# Patient Record
Sex: Male | Born: 1950 | Race: Black or African American | Hispanic: No | Marital: Married | Smoking: Never smoker
Health system: Southern US, Community
[De-identification: ages and names within clinical notes are randomized; demographics above are authoritative.]

---

## 1999-10-30 ENCOUNTER — Encounter: Payer: Self-pay | Admitting: Otolaryngology

## 1999-10-30 ENCOUNTER — Encounter: Admission: RE | Admit: 1999-10-30 | Discharge: 1999-10-30 | Payer: Self-pay | Admitting: Otolaryngology

## 2009-04-03 ENCOUNTER — Ambulatory Visit: Payer: Self-pay | Admitting: Internal Medicine

## 2009-04-03 ENCOUNTER — Ambulatory Visit (HOSPITAL_BASED_OUTPATIENT_CLINIC_OR_DEPARTMENT_OTHER): Admission: RE | Admit: 2009-04-03 | Discharge: 2009-04-03 | Payer: Self-pay | Admitting: Otolaryngology

## 2009-12-13 ENCOUNTER — Encounter: Admission: RE | Admit: 2009-12-13 | Discharge: 2009-12-13 | Payer: Self-pay | Admitting: Emergency Medicine

## 2011-04-05 NOTE — Procedures (Signed)
NAME:  Brandon Nixon, Brandon Nixon                  ACCOUNT NO.:  [id number]   MEDICAL RECORD NO.:  [id number]          PATIENT TYPE:  OUT   LOCATION:  SLEEP CENTER                 FACILITY:  [facility]   PHYSICIAN:  Clinton D. Maple Hudson, MD, FCCP, FACPDATE OF BIRTH:  1949/01/14   DATE OF STUDY:                            NOCTURNAL POLYSOMNOGRAM   REFERRING PHYSICIAN:  Jefry H. Pollyann Kennedy, MD   INDICATION FOR STUDY:  Hypersomnia with sleep apnea.   EPWORTH SLEEPINESS SCORE:  Epworth sleepiness score 8/24.  BMI 28.  Weight 190 pounds, height 69 inches.  Neck 14 inches.   MEDICATIONS:  Home medication charted and reviewed.   SLEEP ARCHITECTURE:  Split study protocol.  During the diagnostic phase,  total sleep time 143 minutes with sleep efficiency 77.4%.  Stage I was  13.2%, stage II 62%, stage III absent, REM 24.7% of total sleep time.  Sleep latency 27 minutes, REM latency 40 minutes, awake after sleep  onset 15 minutes, arousal index 46 indicating increased EEG arousal.  No  bedtime medication was taken.   RESPIRATORY DATA:  Split study protocol.  Apnea/hypopnea index (AHI)  38.9 per hour.  A total of 93 events was scored including 79 obstructive  apneas, one mixed apnea, and 13 hypopneas.  Events were more common  while supine, but seen in all sleep positions.  REM AHI 49.  CPAP was  then titrated to 12 CWP, AHI 0 per hour.  He wore a medium ResMed  Quattro full-face mask with heated humidifier.   OXYGEN DATA:  Moderately loud snoring before CPAP with oxygen  desaturation to a nadir of 68%.  After CPAP control, mean oxygen  saturation held 96.7% on room air.   CARDIAC DATA:  Sinus rhythm with frequent PVCs including intervals of  ventricular bigeminy.   MOVEMENT-PARASOMNIA:  No significant movement disturbance.  Bathroom x1.   IMPRESSIONS-RECOMMENDATIONS:  1. Moderate obstructive sleep apnea/hypopnea syndrome, AHI 38.9 per      hour.  Events in all positions, but more common while supine.  Moderately loud snoring with oxygen desaturation to a nadir of 68%.  2. Successful CPAP titration to 12 CWP, AHI 0 per hour.  He wore a      medium ResMed Quattro full-face mask      with heated humidifier.  3. Frequent PVCs including ventricular bigeminy.      Clinton D. Maple Hudson, MD, [facility], FACP  Diplomate, Biomedical engineer of Sleep Medicine  Electronically Signed     CDY/MEDQ  D:  04/08/2009 09:49:53  T:  04/09/2009 03:52:06  Job:  [id number]

## 2012-08-28 ENCOUNTER — Ambulatory Visit
Admission: RE | Admit: 2012-08-28 | Discharge: 2012-08-28 | Disposition: A | Discharge status: Home / Self Care | Payer: 59 | Source: Ambulatory Visit | Attending: Family Medicine | Admitting: Family Medicine

## 2012-08-28 ENCOUNTER — Other Ambulatory Visit: Payer: Self-pay | Admitting: Family Medicine

## 2012-08-28 DIAGNOSIS — M25532 Pain in left wrist: Secondary | ICD-10-CM

## 2012-08-28 DIAGNOSIS — M25531 Pain in right wrist: Secondary | ICD-10-CM

## 2015-05-30 ENCOUNTER — Other Ambulatory Visit: Payer: Self-pay | Admitting: Family Medicine

## 2015-05-30 DIAGNOSIS — Z0000 Encounter for general adult medical examination without abnormal findings: Secondary | ICD-10-CM

## 2015-06-16 ENCOUNTER — Ambulatory Visit
Admission: RE | Admit: 2015-06-16 | Discharge: 2015-06-16 | Disposition: A | Discharge status: Home / Self Care | Payer: Medicare Other | Source: Ambulatory Visit | Attending: Family Medicine | Admitting: Family Medicine

## 2015-06-16 DIAGNOSIS — Z0000 Encounter for general adult medical examination without abnormal findings: Secondary | ICD-10-CM

## 2015-12-08 DIAGNOSIS — E785 Hyperlipidemia, unspecified: Secondary | ICD-10-CM | POA: Diagnosis not present

## 2016-02-23 DIAGNOSIS — M4806 Spinal stenosis, lumbar region: Secondary | ICD-10-CM | POA: Diagnosis not present

## 2016-02-23 DIAGNOSIS — E785 Hyperlipidemia, unspecified: Secondary | ICD-10-CM | POA: Diagnosis not present

## 2016-02-23 DIAGNOSIS — M5126 Other intervertebral disc displacement, lumbar region: Secondary | ICD-10-CM | POA: Diagnosis not present

## 2016-02-23 DIAGNOSIS — M5410 Radiculopathy, site unspecified: Secondary | ICD-10-CM | POA: Diagnosis not present

## 2016-02-27 DIAGNOSIS — H1045 Other chronic allergic conjunctivitis: Secondary | ICD-10-CM | POA: Diagnosis not present

## 2016-02-27 DIAGNOSIS — H43811 Vitreous degeneration, right eye: Secondary | ICD-10-CM | POA: Diagnosis not present

## 2016-02-27 DIAGNOSIS — H2513 Age-related nuclear cataract, bilateral: Secondary | ICD-10-CM | POA: Diagnosis not present

## 2016-11-05 DIAGNOSIS — I10 Essential (primary) hypertension: Secondary | ICD-10-CM | POA: Diagnosis not present

## 2016-11-05 DIAGNOSIS — E1122 Type 2 diabetes mellitus with diabetic chronic kidney disease: Secondary | ICD-10-CM | POA: Diagnosis not present

## 2016-11-05 DIAGNOSIS — N182 Chronic kidney disease, stage 2 (mild): Secondary | ICD-10-CM | POA: Diagnosis not present

## 2016-11-05 DIAGNOSIS — Z79899 Other long term (current) drug therapy: Secondary | ICD-10-CM | POA: Diagnosis not present

## 2016-11-05 DIAGNOSIS — E785 Hyperlipidemia, unspecified: Secondary | ICD-10-CM | POA: Diagnosis not present

## 2016-11-05 DIAGNOSIS — Z23 Encounter for immunization: Secondary | ICD-10-CM | POA: Diagnosis not present

## 2016-11-05 DIAGNOSIS — E1165 Type 2 diabetes mellitus with hyperglycemia: Secondary | ICD-10-CM | POA: Diagnosis not present

## 2017-05-02 ENCOUNTER — Other Ambulatory Visit: Payer: Self-pay | Admitting: Family Medicine

## 2017-05-02 ENCOUNTER — Ambulatory Visit
Admission: RE | Admit: 2017-05-02 | Discharge: 2017-05-02 | Disposition: A | Discharge status: Home / Self Care | Payer: 59 | Source: Ambulatory Visit | Attending: Family Medicine | Admitting: Family Medicine

## 2017-05-02 DIAGNOSIS — M1990 Unspecified osteoarthritis, unspecified site: Secondary | ICD-10-CM | POA: Diagnosis not present

## 2017-05-23 DIAGNOSIS — I10 Essential (primary) hypertension: Secondary | ICD-10-CM | POA: Diagnosis not present

## 2017-05-23 DIAGNOSIS — Z79899 Other long term (current) drug therapy: Secondary | ICD-10-CM | POA: Diagnosis not present

## 2017-05-23 DIAGNOSIS — E1122 Type 2 diabetes mellitus with diabetic chronic kidney disease: Secondary | ICD-10-CM | POA: Diagnosis not present

## 2017-05-23 DIAGNOSIS — Z0000 Encounter for general adult medical examination without abnormal findings: Secondary | ICD-10-CM | POA: Diagnosis not present

## 2017-05-23 DIAGNOSIS — J309 Allergic rhinitis, unspecified: Secondary | ICD-10-CM | POA: Diagnosis not present

## 2017-05-23 DIAGNOSIS — Z6830 Body mass index (BMI) 30.0-30.9, adult: Secondary | ICD-10-CM | POA: Diagnosis not present

## 2017-05-23 DIAGNOSIS — Z1159 Encounter for screening for other viral diseases: Secondary | ICD-10-CM | POA: Diagnosis not present

## 2017-06-27 DIAGNOSIS — D126 Benign neoplasm of colon, unspecified: Secondary | ICD-10-CM | POA: Diagnosis not present

## 2017-06-27 DIAGNOSIS — K635 Polyp of colon: Secondary | ICD-10-CM | POA: Diagnosis not present

## 2017-06-27 DIAGNOSIS — K648 Other hemorrhoids: Secondary | ICD-10-CM | POA: Diagnosis not present

## 2017-11-17 DIAGNOSIS — N529 Male erectile dysfunction, unspecified: Secondary | ICD-10-CM | POA: Diagnosis not present

## 2017-11-17 DIAGNOSIS — M19041 Primary osteoarthritis, right hand: Secondary | ICD-10-CM | POA: Diagnosis not present

## 2018-07-06 DIAGNOSIS — E1122 Type 2 diabetes mellitus with diabetic chronic kidney disease: Secondary | ICD-10-CM | POA: Diagnosis not present

## 2018-07-06 DIAGNOSIS — M25569 Pain in unspecified knee: Secondary | ICD-10-CM | POA: Diagnosis not present

## 2018-07-06 DIAGNOSIS — Z0000 Encounter for general adult medical examination without abnormal findings: Secondary | ICD-10-CM | POA: Diagnosis not present

## 2018-07-06 DIAGNOSIS — I10 Essential (primary) hypertension: Secondary | ICD-10-CM | POA: Diagnosis not present

## 2018-07-06 DIAGNOSIS — Z125 Encounter for screening for malignant neoplasm of prostate: Secondary | ICD-10-CM | POA: Diagnosis not present

## 2018-07-06 DIAGNOSIS — Z79899 Other long term (current) drug therapy: Secondary | ICD-10-CM | POA: Diagnosis not present

## 2018-07-06 DIAGNOSIS — E785 Hyperlipidemia, unspecified: Secondary | ICD-10-CM | POA: Diagnosis not present

## 2018-10-30 DIAGNOSIS — M7122 Synovial cyst of popliteal space [Baker], left knee: Secondary | ICD-10-CM | POA: Diagnosis not present

## 2018-11-03 ENCOUNTER — Other Ambulatory Visit: Payer: Self-pay | Admitting: Family Medicine

## 2018-11-03 DIAGNOSIS — M7122 Synovial cyst of popliteal space [Baker], left knee: Secondary | ICD-10-CM

## 2018-11-05 ENCOUNTER — Ambulatory Visit
Admission: RE | Admit: 2018-11-05 | Discharge: 2018-11-05 | Disposition: A | Discharge status: Home / Self Care | Payer: Managed Care, Other (non HMO) | Source: Ambulatory Visit | Attending: Family Medicine | Admitting: Family Medicine

## 2018-11-05 DIAGNOSIS — M25562 Pain in left knee: Secondary | ICD-10-CM | POA: Diagnosis not present

## 2018-11-05 DIAGNOSIS — M7122 Synovial cyst of popliteal space [Baker], left knee: Secondary | ICD-10-CM

## 2019-09-20 DIAGNOSIS — E785 Hyperlipidemia, unspecified: Secondary | ICD-10-CM | POA: Diagnosis not present

## 2019-09-20 DIAGNOSIS — E1122 Type 2 diabetes mellitus with diabetic chronic kidney disease: Secondary | ICD-10-CM | POA: Diagnosis not present

## 2019-09-20 DIAGNOSIS — Z0000 Encounter for general adult medical examination without abnormal findings: Secondary | ICD-10-CM | POA: Diagnosis not present

## 2019-09-20 DIAGNOSIS — Z79899 Other long term (current) drug therapy: Secondary | ICD-10-CM | POA: Diagnosis not present

## 2019-09-20 DIAGNOSIS — Z7984 Long term (current) use of oral hypoglycemic drugs: Secondary | ICD-10-CM | POA: Diagnosis not present

## 2019-09-20 DIAGNOSIS — Z125 Encounter for screening for malignant neoplasm of prostate: Secondary | ICD-10-CM | POA: Diagnosis not present

## 2019-09-23 DIAGNOSIS — Z0000 Encounter for general adult medical examination without abnormal findings: Secondary | ICD-10-CM | POA: Diagnosis not present

## 2019-09-23 DIAGNOSIS — E1122 Type 2 diabetes mellitus with diabetic chronic kidney disease: Secondary | ICD-10-CM | POA: Diagnosis not present

## 2019-09-23 DIAGNOSIS — I10 Essential (primary) hypertension: Secondary | ICD-10-CM | POA: Diagnosis not present

## 2019-09-23 DIAGNOSIS — N182 Chronic kidney disease, stage 2 (mild): Secondary | ICD-10-CM | POA: Diagnosis not present

## 2019-09-23 DIAGNOSIS — E785 Hyperlipidemia, unspecified: Secondary | ICD-10-CM | POA: Diagnosis not present

## 2019-09-23 DIAGNOSIS — G4733 Obstructive sleep apnea (adult) (pediatric): Secondary | ICD-10-CM | POA: Diagnosis not present

## 2019-09-23 DIAGNOSIS — Z79899 Other long term (current) drug therapy: Secondary | ICD-10-CM | POA: Diagnosis not present

## 2019-09-23 DIAGNOSIS — M48061 Spinal stenosis, lumbar region without neurogenic claudication: Secondary | ICD-10-CM | POA: Diagnosis not present

## 2019-12-23 DIAGNOSIS — K006 Disturbances in tooth eruption: Secondary | ICD-10-CM | POA: Diagnosis not present

## 2020-01-10 DIAGNOSIS — N182 Chronic kidney disease, stage 2 (mild): Secondary | ICD-10-CM | POA: Diagnosis not present

## 2020-01-10 DIAGNOSIS — E1122 Type 2 diabetes mellitus with diabetic chronic kidney disease: Secondary | ICD-10-CM | POA: Diagnosis not present

## 2020-01-19 DIAGNOSIS — R69 Illness, unspecified: Secondary | ICD-10-CM | POA: Diagnosis not present

## 2020-03-08 DIAGNOSIS — M19041 Primary osteoarthritis, right hand: Secondary | ICD-10-CM | POA: Diagnosis not present

## 2020-05-01 DIAGNOSIS — K006 Disturbances in tooth eruption: Secondary | ICD-10-CM | POA: Diagnosis not present

## 2020-06-08 DIAGNOSIS — E1122 Type 2 diabetes mellitus with diabetic chronic kidney disease: Secondary | ICD-10-CM | POA: Diagnosis not present

## 2020-06-08 DIAGNOSIS — N182 Chronic kidney disease, stage 2 (mild): Secondary | ICD-10-CM | POA: Diagnosis not present

## 2020-07-14 DIAGNOSIS — E119 Type 2 diabetes mellitus without complications: Secondary | ICD-10-CM | POA: Diagnosis not present

## 2020-09-02 ENCOUNTER — Ambulatory Visit: Payer: Self-pay

## 2020-09-02 ENCOUNTER — Ambulatory Visit: Payer: Managed Care, Other (non HMO) | Attending: Internal Medicine

## 2020-09-02 DIAGNOSIS — Z23 Encounter for immunization: Secondary | ICD-10-CM

## 2020-09-02 NOTE — Progress Notes (Signed)
   Covid-19 Vaccination Clinic  Name:  Brandon Nixon    MRN: [id number] DOB: 07-27-1951  09/02/2020  Brandon Nixon was observed post Covid-19 immunization for 15 minutes without incident. He was provided with Vaccine Information Sheet and instruction to access the V-Safe system.   Brandon Nixon was instructed to call 911 with any severe reactions post vaccine: Marland Kitchen Difficulty breathing  . Swelling of face and throat  . A fast heartbeat  . A bad rash all over body  . Dizziness and weakness

## 2020-10-23 DIAGNOSIS — M1990 Unspecified osteoarthritis, unspecified site: Secondary | ICD-10-CM | POA: Diagnosis not present

## 2020-10-23 DIAGNOSIS — N182 Chronic kidney disease, stage 2 (mild): Secondary | ICD-10-CM | POA: Diagnosis not present

## 2020-10-23 DIAGNOSIS — Z125 Encounter for screening for malignant neoplasm of prostate: Secondary | ICD-10-CM | POA: Diagnosis not present

## 2020-10-23 DIAGNOSIS — G4733 Obstructive sleep apnea (adult) (pediatric): Secondary | ICD-10-CM | POA: Diagnosis not present

## 2020-10-23 DIAGNOSIS — E1122 Type 2 diabetes mellitus with diabetic chronic kidney disease: Secondary | ICD-10-CM | POA: Diagnosis not present

## 2020-10-23 DIAGNOSIS — E785 Hyperlipidemia, unspecified: Secondary | ICD-10-CM | POA: Diagnosis not present

## 2020-10-23 DIAGNOSIS — I10 Essential (primary) hypertension: Secondary | ICD-10-CM | POA: Diagnosis not present

## 2020-10-23 DIAGNOSIS — Z0000 Encounter for general adult medical examination without abnormal findings: Secondary | ICD-10-CM | POA: Diagnosis not present

## 2020-10-23 DIAGNOSIS — M5126 Other intervertebral disc displacement, lumbar region: Secondary | ICD-10-CM | POA: Diagnosis not present

## 2020-10-23 DIAGNOSIS — M5410 Radiculopathy, site unspecified: Secondary | ICD-10-CM | POA: Diagnosis not present

## 2020-10-23 DIAGNOSIS — N529 Male erectile dysfunction, unspecified: Secondary | ICD-10-CM | POA: Diagnosis not present

## 2020-10-23 DIAGNOSIS — J301 Allergic rhinitis due to pollen: Secondary | ICD-10-CM | POA: Diagnosis not present

## 2020-12-22 DIAGNOSIS — R972 Elevated prostate specific antigen [PSA]: Secondary | ICD-10-CM | POA: Diagnosis not present

## 2021-03-07 DIAGNOSIS — E119 Type 2 diabetes mellitus without complications: Secondary | ICD-10-CM | POA: Diagnosis not present

## 2021-04-05 DIAGNOSIS — M48062 Spinal stenosis, lumbar region with neurogenic claudication: Secondary | ICD-10-CM | POA: Diagnosis not present

## 2021-04-18 ENCOUNTER — Other Ambulatory Visit: Payer: Self-pay | Admitting: Family Medicine

## 2021-04-18 DIAGNOSIS — M48062 Spinal stenosis, lumbar region with neurogenic claudication: Secondary | ICD-10-CM

## 2021-04-22 ENCOUNTER — Ambulatory Visit
Admission: RE | Admit: 2021-04-22 | Discharge: 2021-04-22 | Disposition: A | Discharge status: Home / Self Care | Payer: Managed Care, Other (non HMO) | Source: Ambulatory Visit | Attending: Family Medicine | Admitting: Family Medicine

## 2021-04-22 DIAGNOSIS — M48061 Spinal stenosis, lumbar region without neurogenic claudication: Secondary | ICD-10-CM | POA: Diagnosis not present

## 2021-04-22 DIAGNOSIS — M48062 Spinal stenosis, lumbar region with neurogenic claudication: Secondary | ICD-10-CM

## 2021-04-25 DIAGNOSIS — M5416 Radiculopathy, lumbar region: Secondary | ICD-10-CM | POA: Diagnosis not present

## 2021-05-03 DIAGNOSIS — M5116 Intervertebral disc disorders with radiculopathy, lumbar region: Secondary | ICD-10-CM | POA: Diagnosis not present

## 2021-05-03 DIAGNOSIS — M4726 Other spondylosis with radiculopathy, lumbar region: Secondary | ICD-10-CM | POA: Diagnosis not present

## 2021-06-07 ENCOUNTER — Ambulatory Visit: Payer: Managed Care, Other (non HMO) | Attending: Internal Medicine

## 2021-06-07 ENCOUNTER — Other Ambulatory Visit: Payer: Self-pay

## 2021-06-07 ENCOUNTER — Other Ambulatory Visit (HOSPITAL_BASED_OUTPATIENT_CLINIC_OR_DEPARTMENT_OTHER): Payer: Self-pay

## 2021-06-07 DIAGNOSIS — Z23 Encounter for immunization: Secondary | ICD-10-CM

## 2021-06-07 MED ORDER — PFIZER-BIONT COVID-19 VAC-TRIS 30 MCG/0.3ML IM SUSP
INTRAMUSCULAR | 0 refills | Status: AC
  Filled 2021-06-07: qty 0.3, 1d supply, fill #0

## 2021-06-07 NOTE — Progress Notes (Signed)
   Covid-19 Vaccination Clinic  Name:  Brandon Nixon    MRN: [id number] DOB: [birth year]  06/07/2021  [name] was observed post Covid-19 immunization for 15 minutes without incident. He was provided with Vaccine Information Sheet and instruction to access the V-Safe system.   [name] was instructed to call 911 with any severe reactions post vaccine: Difficulty breathing  Swelling of face and throat  A fast heartbeat  A bad rash all over body  Dizziness and weakness   Immunizations Administered     Name Date Dose VIS Date Route   PFIZER Comrnaty(Gray TOP) Covid-19 Vaccine 06/07/2021 10:21 AM 0.3 mL 10/26/2020 Intramuscular   Manufacturer: [employer]   Lot: [id number]   [place]: [phone]

## 2021-06-20 DIAGNOSIS — R972 Elevated prostate specific antigen [PSA]: Secondary | ICD-10-CM | POA: Diagnosis not present

## 2021-06-27 DIAGNOSIS — N400 Enlarged prostate without lower urinary tract symptoms: Secondary | ICD-10-CM | POA: Diagnosis not present

## 2021-07-05 DIAGNOSIS — K529 Noninfective gastroenteritis and colitis, unspecified: Secondary | ICD-10-CM | POA: Diagnosis not present

## 2021-08-01 DIAGNOSIS — I10 Essential (primary) hypertension: Secondary | ICD-10-CM | POA: Diagnosis not present

## 2021-08-01 DIAGNOSIS — R42 Dizziness and giddiness: Secondary | ICD-10-CM | POA: Diagnosis not present

## 2021-08-01 DIAGNOSIS — I959 Hypotension, unspecified: Secondary | ICD-10-CM | POA: Diagnosis not present

## 2021-08-01 DIAGNOSIS — E1122 Type 2 diabetes mellitus with diabetic chronic kidney disease: Secondary | ICD-10-CM | POA: Diagnosis not present

## 2021-08-15 DIAGNOSIS — R69 Illness, unspecified: Secondary | ICD-10-CM | POA: Diagnosis not present

## 2021-08-27 DIAGNOSIS — I10 Essential (primary) hypertension: Secondary | ICD-10-CM | POA: Diagnosis not present

## 2021-08-30 IMAGING — MR MR LUMBAR SPINE W/O CM
6 series · 38 of 48 positions shown · non-contrast
Comparison: 12/13/2009 MRI

CLINICAL DATA: Lumbar spinal stenosis with neurogenic claudication

EXAM:
MRI LUMBAR SPINE WITHOUT CONTRAST
TECHNIQUE: Multiplanar, multisequence MR imaging of the lumbar spine was
performed. No intravenous contrast was administered.

[Series 2: T2 · sagittal · 4.0mm · 1.09mm/px · 6 of 19 slices shown (1 of 3)]
[im 1/19]
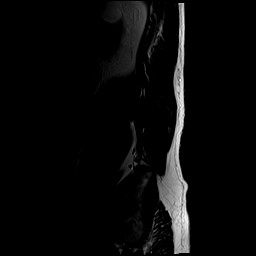
[im 4/19]
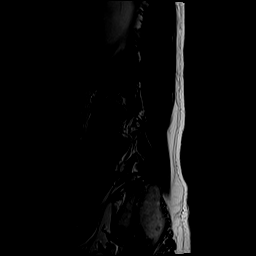
[im 8/19]
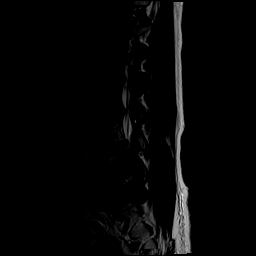
[im 11/19]
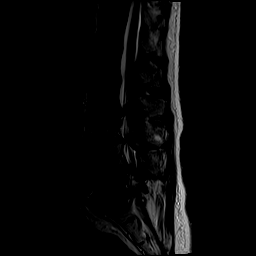
[im 15/19]
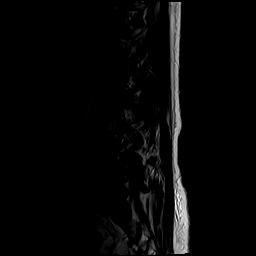
[im 19/19]
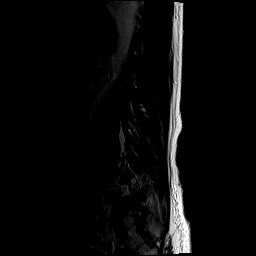

[Series 3: tirm sag · sagittal · 4.0mm · 1.09mm/px · 3 of 19 slices shown]
[im 1/19]
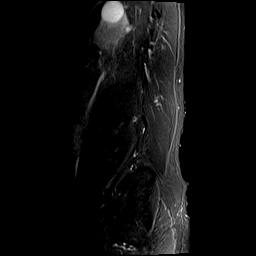
[im 4/19]
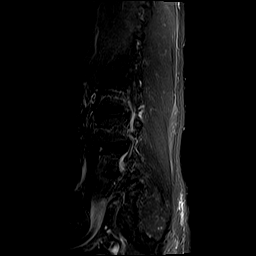
[im 8/19]
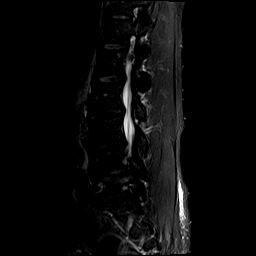

[Series 4: T1 · sagittal · 4.0mm · 1.09mm/px · 6 of 19 slices shown (1 of 2)]
[im 1/19]
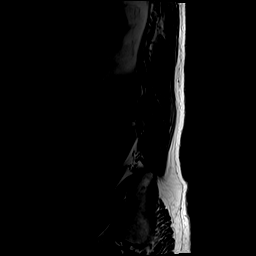
[im 4/19]
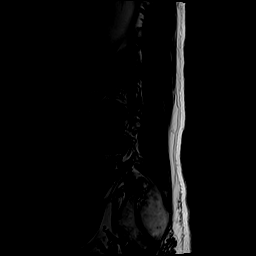
[im 8/19]
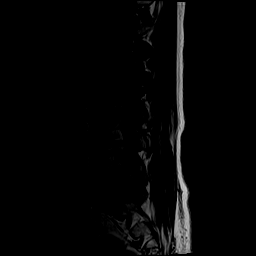
[im 11/19]
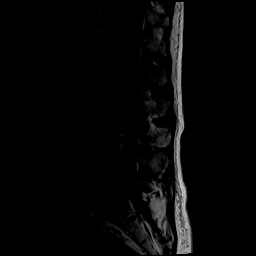
[im 15/19]
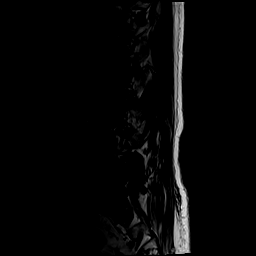
[im 19/19]
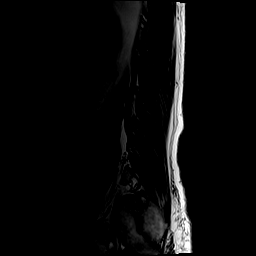

[Series 5: T2 · axial · 4.0mm · 0.39mm/px · z∈[-77,+116]mm · 9 of 39 slices shown (2 of 3)]
[im 1/39]
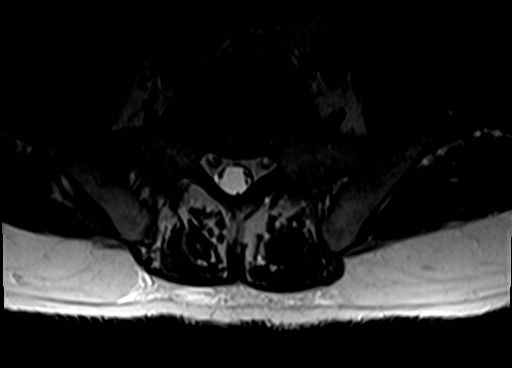
[im 7/39]
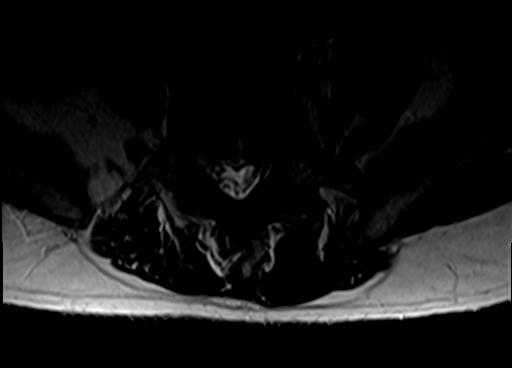
[im 11/39]
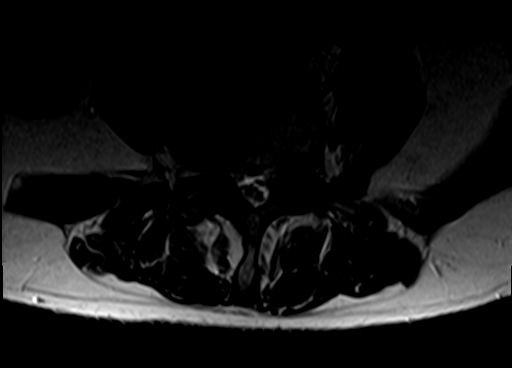
[im 18/39]
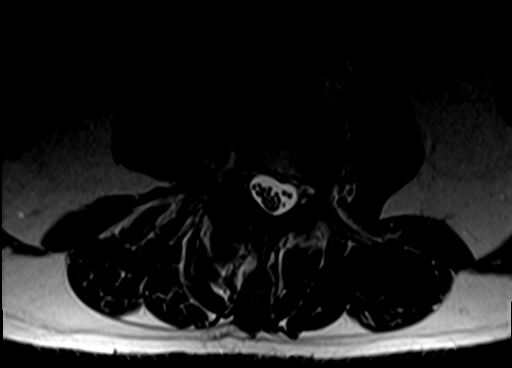
[im 21/39]
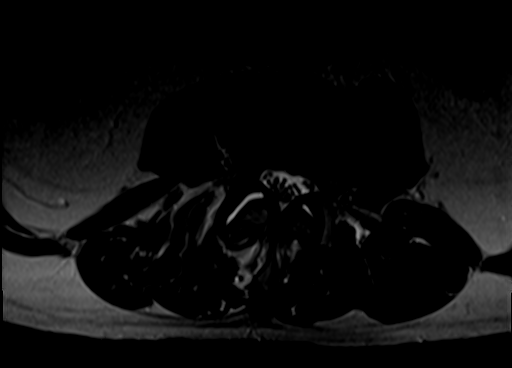
[im 28/39]
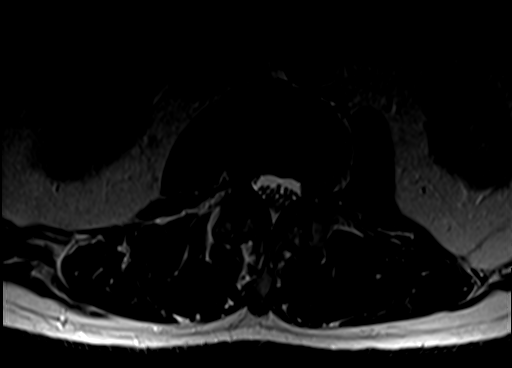
[im 32/39]
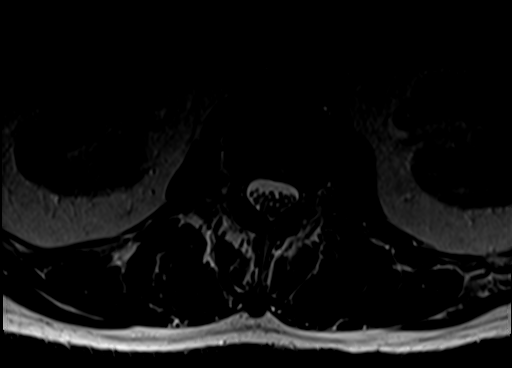
[im 35/39]
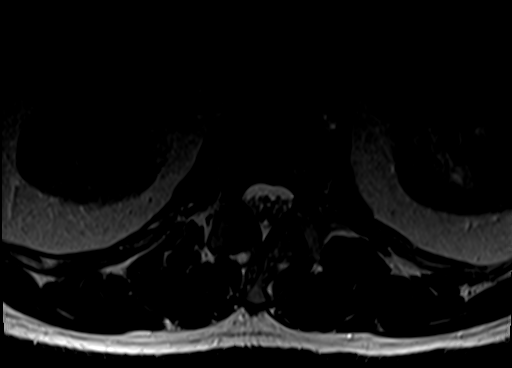
[im 39/39]
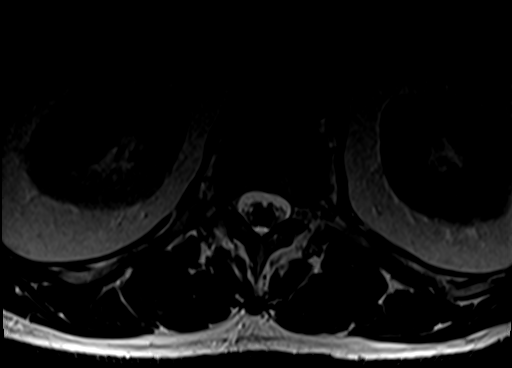

[Series 6: T1 · axial · 4.0mm · 0.39mm/px · z∈[-77,+116]mm · 8 of 39 slices shown (2 of 2)]
[im 1/39]
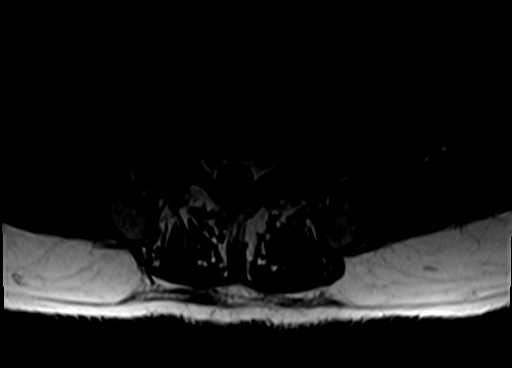
[im 7/39]
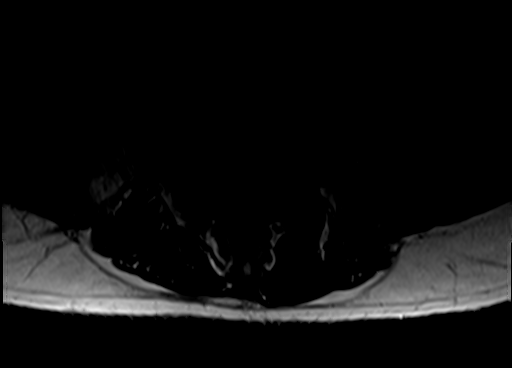
[im 11/39]
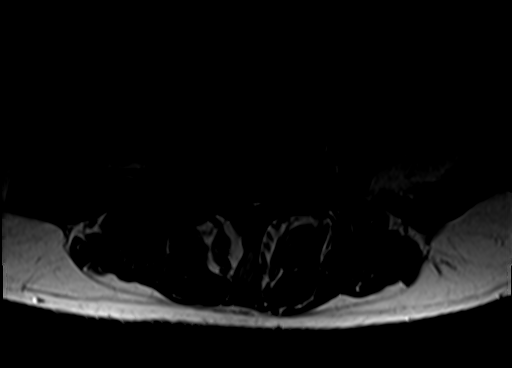
[im 18/39]
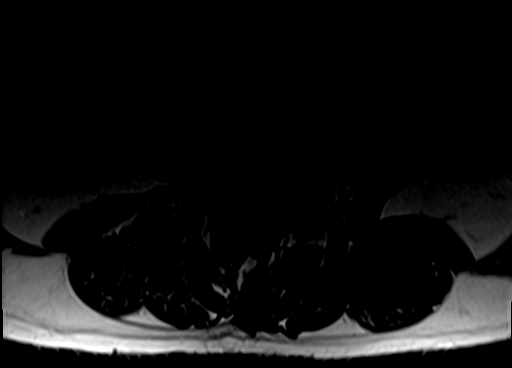
[im 21/39]
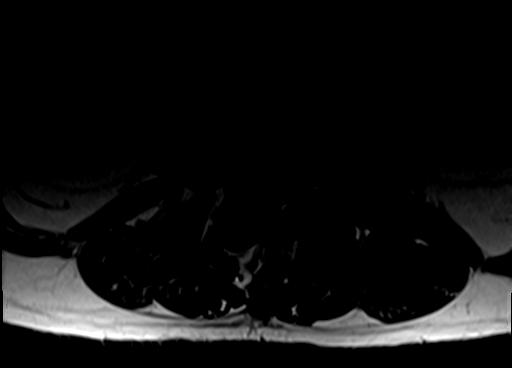
[im 28/39]
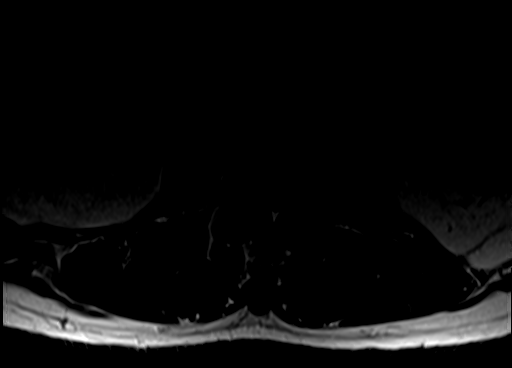
[im 32/39]
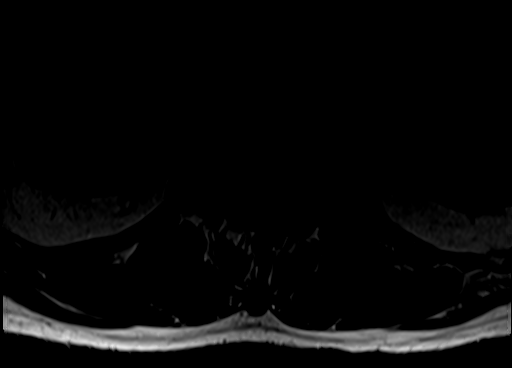
[im 39/39]
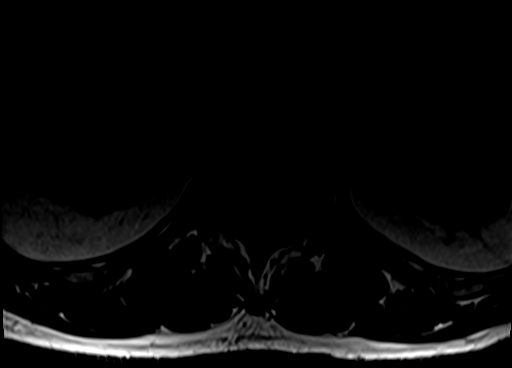

[Series 7: T2 · coronal · 4.0mm · 1.09mm/px · 6 of 21 slices shown (3 of 3)]
[im 1/21]
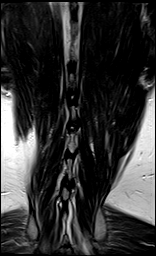
[im 5/21]
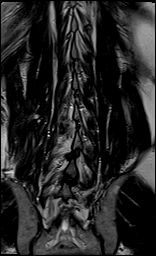
[im 9/21]
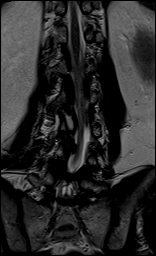
[im 13/21]
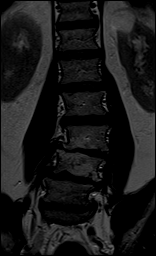
[im 17/21]
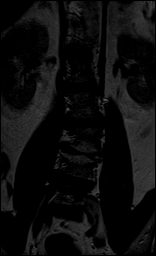
[im 21/21]
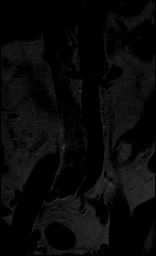

[38 of 48 positions shown; findings below may reference images not displayed]

FINDINGS: Segmentation: The lowest lumbar type non-rib-bearing vertebra is
labeled as L5.

Alignment: Levoconvex lumbar scoliosis with rotary component. 3 mm
of degenerative retrolisthesis at L5-S1.

Vertebrae: Disc desiccation at L2-3, L3-4, L4-5, and L5-S1 with loss
of disc height at all levels between L3 and S1. Mild degenerative
facet edema on the left at L4-5. Degenerative endplate findings at
L3-4, L4-5, and L5-S1.

Conus medullaris and cauda equina: Conus extends to the L1 level.
Conus and cauda equina appear normal.

Paraspinal and other soft tissues: Unremarkable

Disc levels:

L1-2: No impingement.  Mild disc bulge mild left facet arthropathy.

L2-3: Mild bilateral foraminal stenosis and mild right subarticular
lateral recess stenosis due to disc bulge, facet arthropathy, and
intervertebral spurring. Right greater than left facet joint
effusions. Impingement increased from prior.

L3-4: Moderate bilateral foraminal stenosis with mild right
subarticular lateral recess stenosis due to intervertebral and facet
spurring. The left foraminal impingement appears mildly increased
from prior, otherwise stable.

L4-5: Prominent central narrowing of the thecal sac with prominent
left and moderate right foraminal stenosis and substantial bilateral
subarticular lateral recess stenosis. Impingement significantly
worsened from prior. AP diameter of the thecal sac is narrowed down
to about 0.3 cm.

L5-S1: Moderate to prominent left and mild right foraminal stenosis
along with mild left subarticular lateral recess stenosis due to
disc bulge, intervertebral spurring, and facet arthropathy. Similar
to prior.
IMPRESSION: 1. Lumbar spondylosis and degenerative disc disease causing
prominent impingement at L4-5; moderate to prominent impingement at
L5-S1; moderate impingement at L3-4; and mild impingement at L2-3.
2. The L4-5 impingement is substantially increased from prior, and
in particular the central narrowing of the thecal sac is striking
with the AP diameter of the thecal sac narrowed and 0.3 cm.
3. Levoconvex lumbar scoliosis with rotary component.

## 2021-09-21 DIAGNOSIS — E876 Hypokalemia: Secondary | ICD-10-CM | POA: Diagnosis not present

## 2021-09-21 DIAGNOSIS — I10 Essential (primary) hypertension: Secondary | ICD-10-CM | POA: Diagnosis not present

## 2021-10-20 ENCOUNTER — Emergency Department (HOSPITAL_BASED_OUTPATIENT_CLINIC_OR_DEPARTMENT_OTHER)
Admission: EM | Admit: 2021-10-20 | Discharge: 2021-10-20 | Disposition: A | Discharge status: Home / Self Care | Payer: Managed Care, Other (non HMO) | Attending: Emergency Medicine | Admitting: Emergency Medicine

## 2021-10-20 ENCOUNTER — Other Ambulatory Visit: Payer: Self-pay

## 2021-10-20 ENCOUNTER — Encounter (HOSPITAL_BASED_OUTPATIENT_CLINIC_OR_DEPARTMENT_OTHER): Payer: Self-pay

## 2021-10-20 DIAGNOSIS — B9789 Other viral agents as the cause of diseases classified elsewhere: Secondary | ICD-10-CM | POA: Diagnosis not present

## 2021-10-20 DIAGNOSIS — H9213 Otorrhea, bilateral: Secondary | ICD-10-CM | POA: Diagnosis present

## 2021-10-20 DIAGNOSIS — I10 Essential (primary) hypertension: Secondary | ICD-10-CM | POA: Insufficient documentation

## 2021-10-20 DIAGNOSIS — Z20822 Contact with and (suspected) exposure to covid-19: Secondary | ICD-10-CM | POA: Diagnosis not present

## 2021-10-20 DIAGNOSIS — J069 Acute upper respiratory infection, unspecified: Secondary | ICD-10-CM | POA: Insufficient documentation

## 2021-10-20 LAB — RESP PANEL BY RT-PCR (FLU A&B, COVID) ARPGX2
Influenza A by PCR: NEGATIVE
Influenza B by PCR: NEGATIVE
SARS Coronavirus 2 by RT PCR: NEGATIVE

## 2021-10-20 MED ORDER — BENZONATATE 100 MG PO CAPS: 100.0000 mg | ORAL_CAPSULE | Freq: Three times a day (TID) | ORAL | 0 refills | Status: AC

## 2021-10-20 MED ORDER — GUAIFENESIN ER 600 MG PO TB12: 600.0000 mg | ORAL_TABLET | Freq: Two times a day (BID) | ORAL | 0 refills | Status: AC

## 2021-10-20 NOTE — ED Provider Notes (Signed)
[facility] Provider Note   CSN: [id number] Arrival date & time: 10/20/21  1853     History Chief Complaint  Patient presents with   Ear Fullness    Brandon Nixon is a 70 y.o. male.  With past medical history of hypertension who presents emergency department with ear fullness.  He states that for 1 week he has had upper respiratory symptoms including sinus pressure, congestion, cough, eye redness and clear discharge from his eyes.  He states that he feels like he "just came out of a pool or off a plane."  He denies vertigo, lightheadedness or dizziness, hearing loss.  He denies any nausea or vomiting.  Denies fevers.  He denies redness or swelling to the ear or the back of the ear, discharge from his ears.  He denies chest pain or shortness of breath.  He denies sick contacts.   Ear Fullness Pertinent negatives include no chest pain, no abdominal pain, no headaches and no shortness of breath.      History reviewed. No pertinent past medical history.  There are no problems to display for this patient.   History reviewed. No pertinent surgical history.     No family history on file.  Social History   Tobacco Use   Smoking status: Never   Smokeless tobacco: Never  Substance Use Topics   Alcohol use: Never   Drug use: Never    Home Medications Prior to Admission medications   Medication Sig Start Date End Date Taking? Authorizing Provider  COVID-19 mRNA Vac-TriS, Pfizer, (PFIZER-BIONT COVID-19 VAC-TRIS) SUSP injection Inject into the muscle. 06/07/21   Carlyle Basques, MD    Allergies    Patient has no allergy information on record.  Review of Systems   Review of Systems  Constitutional:  Negative for fever.  HENT:  Positive for congestion and sinus pressure. Negative for sinus pain and sore throat.   Eyes:  Positive for discharge and redness. Negative for pain and itching.  Respiratory:  Positive for cough. Negative for shortness of  breath.   Cardiovascular:  Negative for chest pain.  Gastrointestinal:  Negative for abdominal pain.  Neurological:  Negative for dizziness, syncope, light-headedness and headaches.  All other systems reviewed and are negative.  Physical Exam Updated Vital Signs BP 131/81 (BP Location: Right Arm)   Pulse 94   Temp 97.8 F (36.6 C) (Oral)   Resp 16   Ht 5\' 9"  (1.753 m)   Wt 83.9 kg   SpO2 98%   BMI 27.32 kg/m   Physical Exam Vitals and nursing note reviewed.  Constitutional:      General: He is not in acute distress.    Appearance: Normal appearance. He is not ill-appearing or toxic-appearing.  HENT:     Head: Normocephalic and atraumatic.     Right Ear: Tympanic membrane, ear canal and external ear normal. There is no impacted cerumen.     Left Ear: Tympanic membrane, ear canal and external ear normal. There is no impacted cerumen.     Nose: Nose normal.     Mouth/Throat:     Mouth: Mucous membranes are moist.     Pharynx: Oropharynx is clear. Posterior oropharyngeal erythema present.  Eyes:     General: No scleral icterus.       Right eye: Discharge present.        Left eye: Discharge present.    Extraocular Movements: Extraocular movements intact.     Pupils: Pupils are equal, round,  and reactive to light.  Cardiovascular:     Rate and Rhythm: Normal rate and regular rhythm.     Pulses: Normal pulses.     Heart sounds: No murmur heard. Pulmonary:     Effort: Pulmonary effort is normal. No respiratory distress.     Breath sounds: Normal breath sounds.  Musculoskeletal:     Cervical back: Normal range of motion and neck supple.  Lymphadenopathy:     Cervical: Cervical adenopathy present.  Skin:    General: Skin is warm and dry.     Capillary Refill: Capillary refill takes less than 2 seconds.     Findings: No rash.  Neurological:     General: No focal deficit present.     Mental Status: He is alert and oriented to person, place, and time. Mental status is at  baseline.  Psychiatric:        Mood and Affect: Mood normal.        Behavior: Behavior normal.        Thought Content: Thought content normal.        Judgment: Judgment normal.    ED Results / Procedures / Treatments   Labs (all labs ordered are listed, but only abnormal results are displayed) Labs Reviewed  RESP PANEL BY RT-PCR (FLU A&B, COVID) ARPGX2   EKG None  Radiology No results found.  Procedures Procedures   Medications Ordered in ED Medications - No data to display  ED Course  I have reviewed the triage vital signs and the nursing notes.  Pertinent labs & imaging results that were available during my care of the patient were reviewed by me and considered in my medical decision making (see chart for details).    MDM Rules/Calculators/A&P 70 year old male who presents emergency department with ear fullness.  He also has other viral respiratory symptoms including cough, nasal congestion.  COVID and flu negative. His physical exam is unremarkable.  There is no sinus pain or pressure.  No fever, doubt sinusitis. Bilateral TMs without exudate, drainage, impacted cerumen, perforation, bulging.  Do not believe there is any evidence of acute otitis media or acute otitis externa.  Bilateral mastoid without erythema or swelling.  Doubt mastoiditis.  No fevers.  He is nontoxic in appearance. There is no vertigo, nystagmus or hearing loss concerning for vestibular neuritis, labyrinthitis, Mnire's. This is likely viral upper respiratory infection with congestion causing ear fullness. Instructed him to continue to use decongestants cough medication.  Symptoms will likely improve over the next few days.  He is instructed follow-up primary care provider. Verbalized understanding of discharge instructions.  Vitals are stable.  He is safe for discharge. Final Clinical Impression(s) / ED Diagnoses Final diagnoses:  Viral URI with cough    Rx / DC Orders ED Discharge Orders           Ordered    guaiFENesin (MUCINEX) 600 MG 12 hr tablet  2 times daily        10/20/21 2137    benzonatate (TESSALON) 100 MG capsule  Every 8 hours        10/20/21 2137             Mickie Hillier, PA-C 10/20/21 2215    Drenda Freeze, MD 10/20/21 2259

## 2021-10-20 NOTE — ED Triage Notes (Signed)
Patient here POV from Home with Ear Fullness for approximately 1-2 weeks.  Patient states he had a Sore Throat initially that progressed into Ear Fullness, and Congestion.  No Fevers. Ambulatory. A&Ox4. GCS 15. NAD Noted during Triage.

## 2021-10-20 NOTE — Discharge Instructions (Addendum)
You were seen in the emergency department today for cough, congestion and ear fullness.  This is likely all congested related to a viral upper respiratory infection.  It does not appear that you need any antibiotics at this time.  I am prescribing you some Mucinex and Tessalon which are medications that are decongestants and for cough.  For your eyes you can use over-the-counter eyedrops to help lubricate your eyes and provide relief to them.  Please return to the emergency department if you begin to have fevers with shortness of breath or worsening cough or severe pain in your sinuses.  Lease follow-up with your primary care provider in a week

## 2022-11-14 DIAGNOSIS — G4733 Obstructive sleep apnea (adult) (pediatric): Secondary | ICD-10-CM | POA: Diagnosis not present

## 2022-11-15 DIAGNOSIS — S0990XA Unspecified injury of head, initial encounter: Secondary | ICD-10-CM | POA: Diagnosis not present

## 2022-11-22 DIAGNOSIS — Z86010 Personal history of colonic polyps: Secondary | ICD-10-CM | POA: Diagnosis not present

## 2022-11-22 DIAGNOSIS — Z09 Encounter for follow-up examination after completed treatment for conditions other than malignant neoplasm: Secondary | ICD-10-CM | POA: Diagnosis not present

## 2022-11-22 DIAGNOSIS — K5730 Diverticulosis of large intestine without perforation or abscess without bleeding: Secondary | ICD-10-CM | POA: Diagnosis not present

## 2023-03-31 DIAGNOSIS — I10 Essential (primary) hypertension: Secondary | ICD-10-CM | POA: Diagnosis not present

## 2023-03-31 DIAGNOSIS — E1122 Type 2 diabetes mellitus with diabetic chronic kidney disease: Secondary | ICD-10-CM | POA: Diagnosis not present

## 2023-05-26 DIAGNOSIS — G4733 Obstructive sleep apnea (adult) (pediatric): Secondary | ICD-10-CM | POA: Diagnosis not present

## 2023-07-02 DIAGNOSIS — Z79899 Other long term (current) drug therapy: Secondary | ICD-10-CM | POA: Diagnosis not present

## 2023-07-02 DIAGNOSIS — E1122 Type 2 diabetes mellitus with diabetic chronic kidney disease: Secondary | ICD-10-CM | POA: Diagnosis not present

## 2023-08-26 DIAGNOSIS — G4733 Obstructive sleep apnea (adult) (pediatric): Secondary | ICD-10-CM | POA: Diagnosis not present

## 2023-11-13 DIAGNOSIS — J019 Acute sinusitis, unspecified: Secondary | ICD-10-CM | POA: Diagnosis not present

## 2023-11-27 DIAGNOSIS — G4733 Obstructive sleep apnea (adult) (pediatric): Secondary | ICD-10-CM | POA: Diagnosis not present

## 2024-02-24 DIAGNOSIS — I10 Essential (primary) hypertension: Secondary | ICD-10-CM | POA: Diagnosis not present

## 2024-02-24 DIAGNOSIS — E1122 Type 2 diabetes mellitus with diabetic chronic kidney disease: Secondary | ICD-10-CM | POA: Diagnosis not present

## 2024-03-24 DIAGNOSIS — J019 Acute sinusitis, unspecified: Secondary | ICD-10-CM | POA: Diagnosis not present

## 2024-03-27 DIAGNOSIS — G4733 Obstructive sleep apnea (adult) (pediatric): Secondary | ICD-10-CM | POA: Diagnosis not present

## 2024-04-20 DIAGNOSIS — E119 Type 2 diabetes mellitus without complications: Secondary | ICD-10-CM | POA: Diagnosis not present

## 2024-06-04 DIAGNOSIS — E1165 Type 2 diabetes mellitus with hyperglycemia: Secondary | ICD-10-CM | POA: Diagnosis not present

## 2024-06-27 DIAGNOSIS — G4733 Obstructive sleep apnea (adult) (pediatric): Secondary | ICD-10-CM | POA: Diagnosis not present

## 2024-07-08 DIAGNOSIS — W57XXXA Bitten or stung by nonvenomous insect and other nonvenomous arthropods, initial encounter: Secondary | ICD-10-CM | POA: Diagnosis not present

## 2024-07-08 DIAGNOSIS — L089 Local infection of the skin and subcutaneous tissue, unspecified: Secondary | ICD-10-CM | POA: Diagnosis not present

## 2024-07-08 DIAGNOSIS — E1122 Type 2 diabetes mellitus with diabetic chronic kidney disease: Secondary | ICD-10-CM | POA: Diagnosis not present

## 2024-07-08 DIAGNOSIS — R609 Edema, unspecified: Secondary | ICD-10-CM | POA: Diagnosis not present
